# Patient Record
Sex: Male | Born: 1975 | Race: Black or African American | Hispanic: No | Marital: Single | Smoking: Current every day smoker
Health system: Southern US, Community
[De-identification: ages and names within clinical notes are randomized; demographics above are authoritative.]

---

## 2019-06-13 ENCOUNTER — Encounter: Payer: Self-pay | Admitting: Emergency Medicine

## 2019-06-13 ENCOUNTER — Emergency Department: Payer: Self-pay

## 2019-06-13 ENCOUNTER — Emergency Department
Admission: EM | Admit: 2019-06-13 | Discharge: 2019-06-13 | Disposition: A | Discharge status: Home / Self Care | Payer: Self-pay | Attending: Emergency Medicine | Admitting: Emergency Medicine

## 2019-06-13 ENCOUNTER — Other Ambulatory Visit: Payer: Self-pay

## 2019-06-13 DIAGNOSIS — M7542 Impingement syndrome of left shoulder: Secondary | ICD-10-CM | POA: Insufficient documentation

## 2019-06-13 DIAGNOSIS — F17210 Nicotine dependence, cigarettes, uncomplicated: Secondary | ICD-10-CM | POA: Insufficient documentation

## 2019-06-13 LAB — BASIC METABOLIC PANEL
Anion gap: 6 (ref 5–15)
BUN: 13 mg/dL (ref 6–20)
CO2: 29 mmol/L (ref 22–32)
Calcium: 9.4 mg/dL (ref 8.9–10.3)
Chloride: 101 mmol/L (ref 98–111)
Creatinine, Ser: 1.21 mg/dL (ref 0.61–1.24)
GFR calc Af Amer: >60 mL/min (ref >60)
GFR calc non Af Amer: >60 mL/min (ref >60)
Glucose, Bld: 106 mg/dL — ABNORMAL HIGH (ref 70–99)
Potassium: 3.7 mmol/L (ref 3.5–5.1)
Sodium: 136 mmol/L (ref 135–145)

## 2019-06-13 LAB — CBC
HCT: 50.1 % (ref 39.0–52.0)
Hemoglobin: 17.2 g/dL — ABNORMAL HIGH (ref 13.0–17.0)
MCH: 31 pg (ref 26.0–34.0)
MCHC: 34.3 g/dL (ref 30.0–36.0)
MCV: 90.4 fL (ref 80.0–100.0)
Platelets: 350 10*3/uL (ref 150–400)
RBC: 5.54 MIL/uL (ref 4.22–5.81)
RDW: 12.5 % (ref 11.5–15.5)
WBC: 6.2 10*3/uL (ref 4.0–10.5)
nRBC: 0 % (ref 0.0–0.2)

## 2019-06-13 LAB — TROPONIN I (HIGH SENSITIVITY)
Troponin I (High Sensitivity): <2 ng/L (ref <18)
Troponin I (High Sensitivity): <2 ng/L (ref <18)

## 2019-06-13 MED ORDER — SODIUM CHLORIDE 0.9% FLUSH: 3.0000 mL | Freq: Once | INTRAVENOUS | Status: DC

## 2019-06-13 MED ORDER — MELOXICAM 15 MG PO TABS: 15.0000 mg | ORAL_TABLET | Freq: Every day | ORAL | 0 refills | Status: AC

## 2019-06-13 NOTE — ED Provider Notes (Signed)
[facility] Emergency Department Provider Note  ____________________________________________  Time seen: Approximately 1:37 PM  I have reviewed the triage vital signs and the nursing notes.   HISTORY  Chief Complaint Arm Pain    HPI Tyler Beasley is a 44 y.o. male who presents the emergency department complaining of left shoulder pain.  Patient states that he is experienced constant shoulder pain that is intermittently fluctuating in pain levels for approximately 3 to 4 weeks.  Patient states that it radiates into the front of his chest as well as down his left arm.  Patient denies any frank substernal pain and states that typically he can palpate the left anterior chest wall and reproduce the symptoms.  No neck pain or stiffness.  No reported injuries.  Patient states that he is also intermittently had some vision changes since the arm pain began.  No headaches.  Patient denies again any sources of trauma.  Patient is concerned for his heart as both his mother and his brother died at an early age from heart attacks.         History reviewed. No pertinent past medical history.  There are no problems to display for this patient.   History reviewed. No pertinent surgical history.  Prior to Admission medications   Not on File    Allergies Patient has no known allergies.  No family history on file.  Social History Social History   Tobacco Use  . Smoking status: Current Every Day Smoker  . Smokeless tobacco: Never Used  Substance Use Topics  . Alcohol use: Yes  . Drug use: Yes    Comment: Ecstacy pills     Review of Systems  Constitutional: No fever/chills Eyes: No visual changes. No discharge.  Intermittent vision changes the patient associates with pain ENT: No upper respiratory complaints. Cardiovascular: no chest pain. Respiratory: no cough. No SOB. Gastrointestinal: No abdominal pain.  No nausea, no vomiting.  No diarrhea.  No  constipation. Musculoskeletal: Left anterior chest wall, left shoulder, left arm pain Skin: Negative for rash, abrasions, lacerations, ecchymosis. Neurological: Negative for headaches, focal weakness or numbness. 10-point ROS otherwise negative.  ____________________________________________   PHYSICAL EXAM:  VITAL SIGNS: ED Triage Vitals  Enc Vitals Group     BP 06/13/19 1032 (!) 149/88     Pulse Rate 06/13/19 1032 88     Resp 06/13/19 1032 20     Temp 06/13/19 1032 97.9 F (36.6 C)     Temp Source 06/13/19 1032 Oral     SpO2 06/13/19 1032 99 %     Weight 06/13/19 1030 185 lb (83.9 kg)     Height 06/13/19 1030 5\' 11"  (1.803 m)     Head Circumference --      Peak Flow --      Pain Score 06/13/19 1030 10     Pain Loc --      Pain Edu? --      Excl. in GC? --      Constitutional: Alert and oriented. Well appearing and in no acute distress. Eyes: Conjunctivae are normal. PERRL. EOMI. Head: Atraumatic. ENT:      Ears:       Nose: No congestion/rhinnorhea.      Mouth/Throat: Mucous membranes are moist.  Neck: No stridor.  No cervical spine tenderness to palpation  Cardiovascular: Normal rate, regular rhythm. Normal S1 and S2.  Good peripheral circulation. Respiratory: Normal respiratory effort without tachypnea or retractions. Lungs CTAB. Good air entry to  the bases with no decreased or absent breath sounds. Musculoskeletal: Full range of motion to all extremities. No gross deformities appreciated.  Full range of motion to bilateral upper extremities.  Patient is tender palpation over the acromioclavicular joint space.  No other significant tenderness to palpation over the osseous structures of the shoulder and the arm.  Radial pulse and sensation intact distally. Neurologic:  Normal speech and language. No gross focal neurologic deficits are appreciated.  Skin:  Skin is warm, dry and intact. No rash noted. Psychiatric: Mood and affect are normal. Speech and behavior are  normal. Patient exhibits appropriate insight and judgement.   ____________________________________________   LABS (all labs ordered are listed, but only abnormal results are displayed)  Labs Reviewed  BASIC METABOLIC PANEL - Abnormal; Notable for the following components:      Result Value   Glucose, Bld 106 (*)    All other components within normal limits  CBC - Abnormal; Notable for the following components:   Hemoglobin 17.2 (*)    All other components within normal limits  TROPONIN I (HIGH SENSITIVITY)  TROPONIN I (HIGH SENSITIVITY)   ____________________________________________  EKG  ED ECG REPORT I, Charline Bills Karessa Onorato,  personally viewed and interpreted this ECG.   Date: 06/13/2019  EKG Time: 1032 hrs.  Rate: 70 bpm  Rhythm: normal EKG, normal sinus rhythm, there are no previous tracings available for comparison  Axis: Normal axis  Intervals:none  ST&T Change: No ST elevation or depression noted  Normal sinus rhythm.  No STEMI.  ____________________________________________  RADIOLOGY I personally viewed and evaluated these images as part of my medical decision making, as well as reviewing the written report by the radiologist.  DG Chest 2 View  Result Date: 06/13/2019 CLINICAL DATA:  Chest pain with left arm numbness/tingling/pain. EXAM: CHEST - 2 VIEW COMPARISON:  None. FINDINGS: The heart size and mediastinal contours are within normal limits. Both lungs are clear. The visualized skeletal structures are unremarkable. IMPRESSION: No active cardiopulmonary disease. Electronically Signed   By: Marin Olp M.D.   On: 06/13/2019 11:21    ____________________________________________    PROCEDURES  Procedure(s) performed:    Procedures    Medications  sodium chloride flush (NS) 0.9 % injection 3 mL (has no administration in time range)     ____________________________________________   INITIAL IMPRESSION / ASSESSMENT AND PLAN / ED  COURSE  Pertinent labs & imaging results that were available during my care of the patient were reviewed by me and considered in my medical decision making (see chart for details).  Review of the [place] CSRS was performed in accordance of the [place] prior to dispensing any controlled drugs.           Patient's diagnosis is consistent with impingement syndrome of the left shoulder.  Patient presented to the emergency department complaining of left shoulder pain x3 to 4 weeks.  Progressively worsening pain.  No recent trauma.  Patient states that the pain extended into his chest but this was more chest wall as he was able to palpate and reproduce the pain.  Strong cardiac history though so patient was evaluated with EKG, labs.  EKG without evidence of ischemia.  Troponins are within normal limits.  I feel that cardiac etiology is very unlikely.  Findings were consistent with impingement syndrome.  Patient had reported some vision changes that he associated with the pain.  Initially I recommended CT scan but the patient declines.  Patient was neurologically intact and patient understands  the risk of not pursuing a CT.  Patient also requested referral for psychiatry.  Patient states that he has had problems with "with my nerves" for years.  Patient states that he tries to self medicate at home with alcohol and pills.  Patient states that he feels like he should talk to somebody about this time.  He is not suicidal or homicidal does not wish to be seen today, stating I just want "a referral to go talk to somebody.". Patient will be discharged home with prescriptions for meloxicam. Patient is to follow up with primary care and psychiatry as needed or otherwise directed. Patient is given ED precautions to return to the ED for any worsening or new symptoms.     ____________________________________________  FINAL CLINICAL IMPRESSION(S) / ED DIAGNOSES  Final diagnoses:  Impingement syndrome of left shoulder       NEW MEDICATIONS STARTED DURING THIS VISIT:  ED Discharge Orders    None          This chart was dictated using voice recognition software/Dragon. Despite best efforts to proofread, errors can occur which can change the meaning. Any change was purely unintentional.    Racheal Patches, PA-C 06/13/19 1429    Dionne Bucy, MD 06/13/19 1457    Dionne Bucy, MD 06/13/19 1458

## 2019-06-13 NOTE — ED Triage Notes (Signed)
Pt presents to ED via POV with c/o L arm numbness/tingling/pain that radiates into his chest. Pt states intermittent, "I can't see". Pt also c/o intermittent CP. Pt states pain 10/10 to L arm at this time.   A&O x4, NAD noted at this time, VSS in triage, ambulatory with steady gait, skin warm, dry and intact.

## 2019-06-13 NOTE — ED Notes (Signed)
Pt with c/o of chronic left arm pain that he states radiates to his chest. Pt with limited movement and obvious pain with movement. Pt A&O x4 with no c/o of SOB.

## 2019-06-13 NOTE — ED Notes (Signed)
Called for VS x 1.

## 2019-06-13 NOTE — ED Notes (Signed)
Pt verbalized understanding of discharge instructions. NAD at this time. 

## 2022-01-16 IMAGING — CR DG CHEST 2V
1 series · 2 of 2 positions shown · non-contrast
Comparison: None.

CLINICAL DATA: Chest pain with left arm numbness/tingling/pain.

EXAM:
CHEST - 2 VIEW

[Series 1: dg chest 2 view · 0.14mm/px · 2 of 2 slices shown]
[im 1/2]
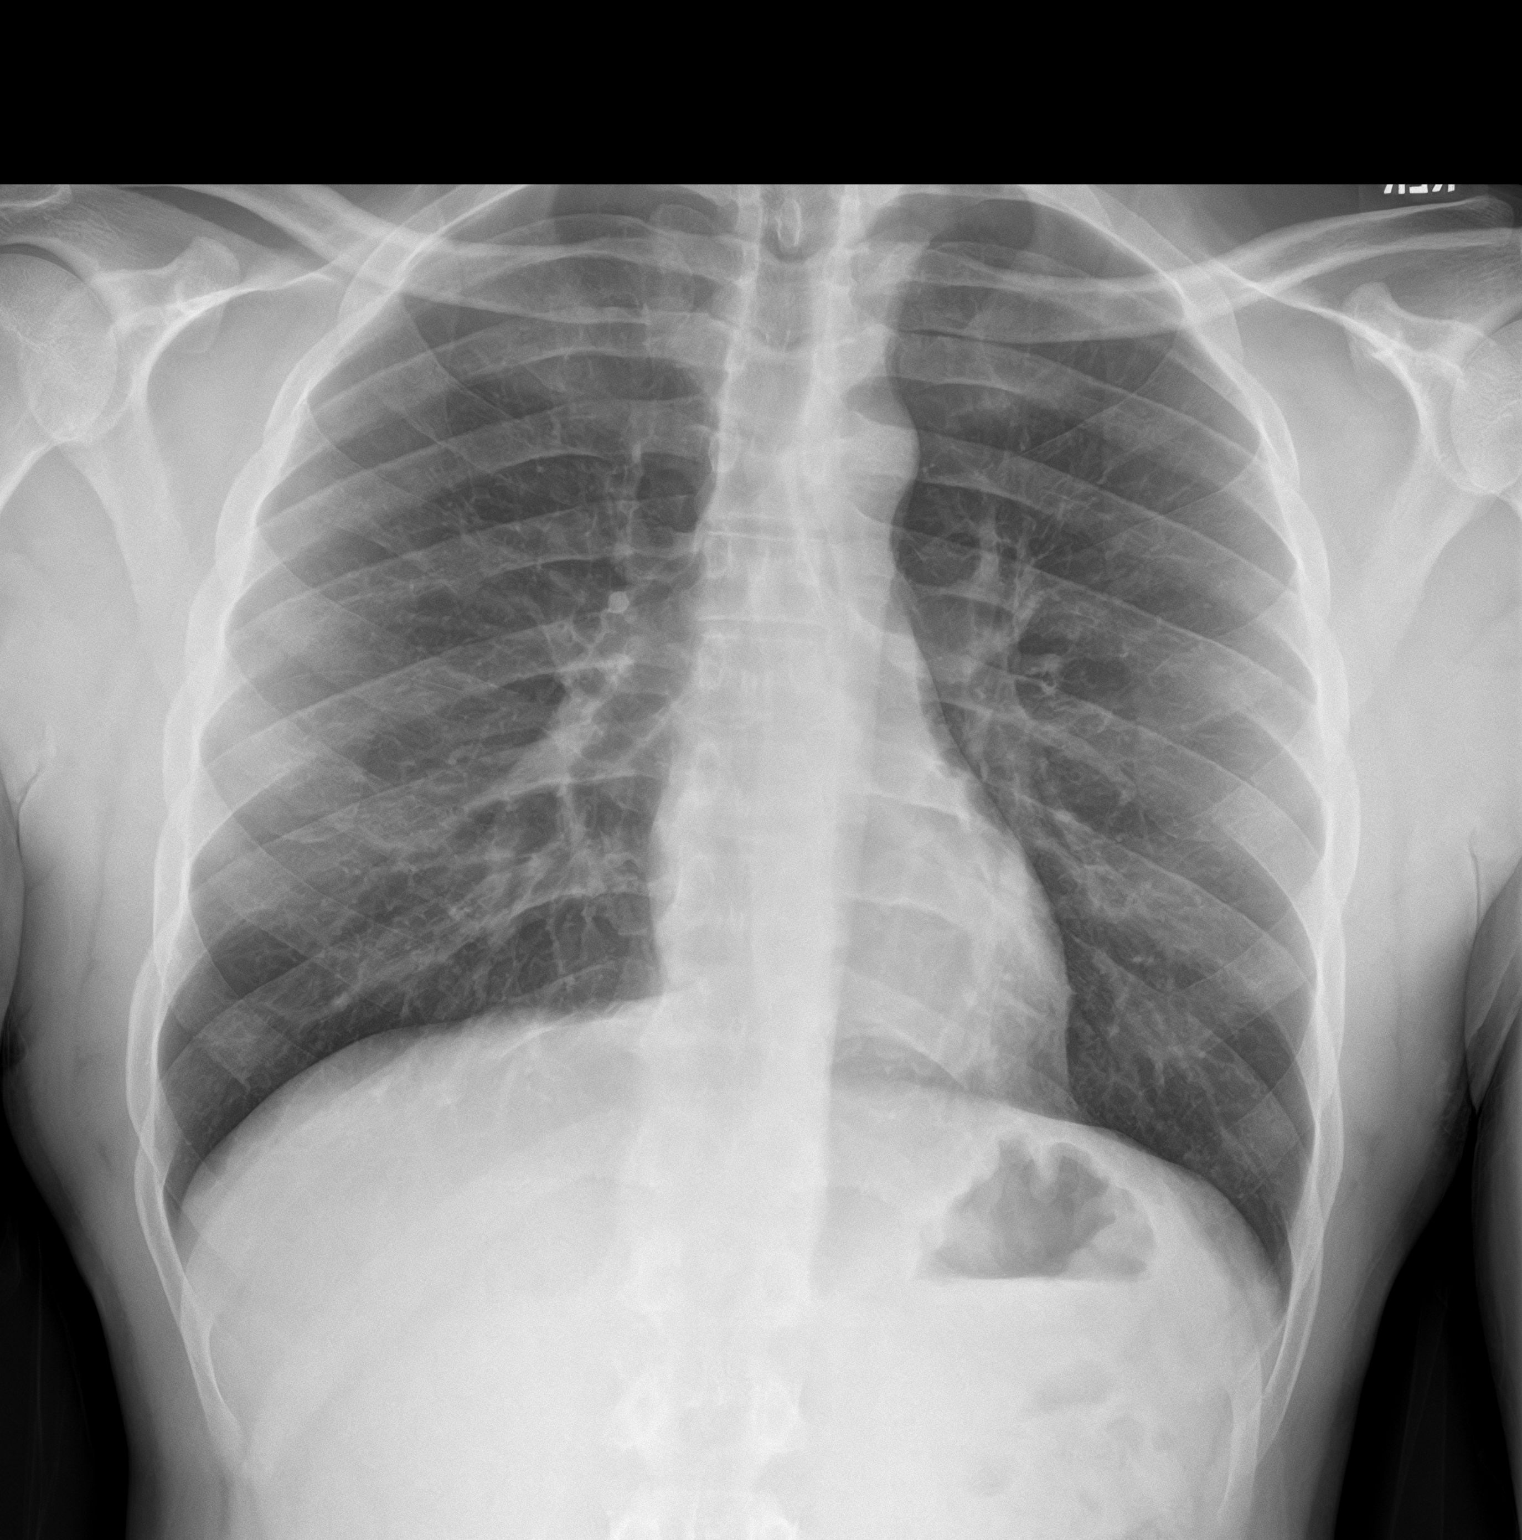
[im 2/2]
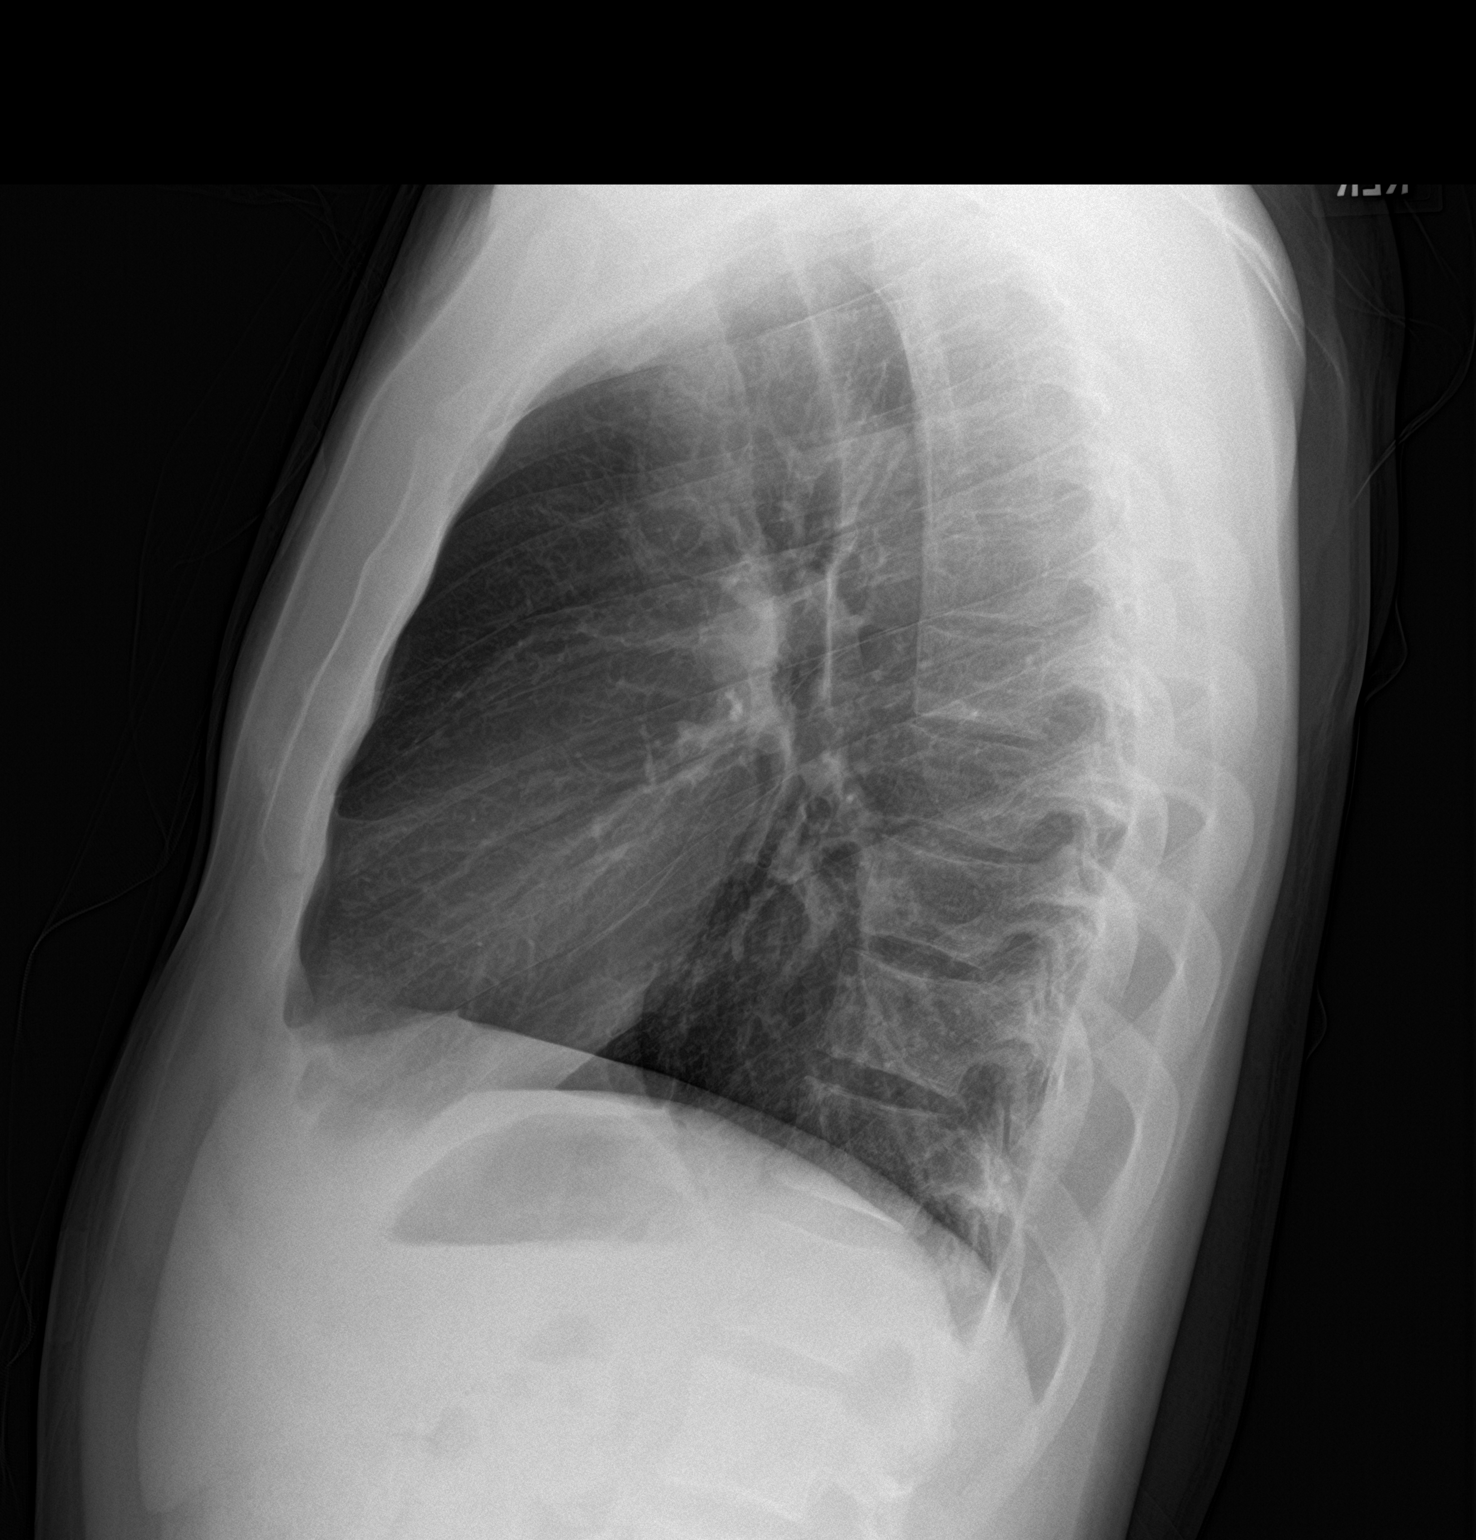

[2 of 2 positions shown; findings below may reference images not displayed]

FINDINGS: The heart size and mediastinal contours are within normal limits.
Both lungs are clear. The visualized skeletal structures are
unremarkable.
IMPRESSION: No active cardiopulmonary disease.
